# Patient Record
Sex: Male | Born: 1985 | Race: Black or African American | Hispanic: No | Marital: Single | Smoking: Current every day smoker
Health system: Southern US, Community
[De-identification: ages and names within clinical notes are randomized; demographics above are authoritative.]

## PROBLEM LIST (undated history)

## (undated) HISTORY — PX: KNEE SURGERY: SHX244

---
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
## 2010-04-19 ENCOUNTER — Emergency Department: Payer: Self-pay | Admitting: Emergency Medicine

## 2011-11-07 ENCOUNTER — Emergency Department: Payer: Self-pay | Admitting: Emergency Medicine

## 2011-11-18 ENCOUNTER — Emergency Department: Payer: Self-pay | Admitting: Emergency Medicine

## 2015-10-17 ENCOUNTER — Encounter (HOSPITAL_COMMUNITY): Payer: Self-pay | Admitting: Emergency Medicine

## 2015-10-17 ENCOUNTER — Emergency Department (HOSPITAL_COMMUNITY): Payer: No Typology Code available for payment source

## 2015-10-17 ENCOUNTER — Emergency Department (HOSPITAL_COMMUNITY)
Admission: EM | Admit: 2015-10-17 | Discharge: 2015-10-17 | Disposition: A | Discharge status: Home / Self Care | Payer: No Typology Code available for payment source | Attending: Emergency Medicine | Admitting: Emergency Medicine

## 2015-10-17 DIAGNOSIS — F17200 Nicotine dependence, unspecified, uncomplicated: Secondary | ICD-10-CM | POA: Insufficient documentation

## 2015-10-17 DIAGNOSIS — Y9389 Activity, other specified: Secondary | ICD-10-CM | POA: Diagnosis not present

## 2015-10-17 DIAGNOSIS — S3992XA Unspecified injury of lower back, initial encounter: Secondary | ICD-10-CM | POA: Diagnosis not present

## 2015-10-17 DIAGNOSIS — S8992XA Unspecified injury of left lower leg, initial encounter: Secondary | ICD-10-CM | POA: Insufficient documentation

## 2015-10-17 DIAGNOSIS — Y998 Other external cause status: Secondary | ICD-10-CM | POA: Insufficient documentation

## 2015-10-17 DIAGNOSIS — Y92410 Unspecified street and highway as the place of occurrence of the external cause: Secondary | ICD-10-CM | POA: Insufficient documentation

## 2015-10-17 DIAGNOSIS — M7918 Myalgia, other site: Secondary | ICD-10-CM

## 2015-10-17 MED ORDER — METHOCARBAMOL 500 MG PO TABS: 1000.0000 mg | ORAL_TABLET | Freq: Four times a day (QID) | ORAL | Status: DC | PRN
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
## 2015-10-17 NOTE — ED Notes (Signed)
Patient left at this time with all belongings. 

## 2015-10-30 ENCOUNTER — Encounter: Payer: Self-pay | Admitting: Emergency Medicine

## 2015-10-30 DIAGNOSIS — M25562 Pain in left knee: Secondary | ICD-10-CM | POA: Insufficient documentation

## 2015-10-30 DIAGNOSIS — F17200 Nicotine dependence, unspecified, uncomplicated: Secondary | ICD-10-CM | POA: Insufficient documentation
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
## 2015-10-31 ENCOUNTER — Emergency Department
Admission: EM | Admit: 2015-10-31 | Discharge: 2015-10-31 | Disposition: A | Discharge status: Home / Self Care | Payer: No Typology Code available for payment source | Attending: Emergency Medicine | Admitting: Emergency Medicine

## 2015-10-31 DIAGNOSIS — M25562 Pain in left knee: Secondary | ICD-10-CM
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
## 2016-02-03 ENCOUNTER — Encounter: Payer: Self-pay | Admitting: Emergency Medicine

## 2016-02-03 ENCOUNTER — Emergency Department
Admission: EM | Admit: 2016-02-03 | Discharge: 2016-02-03 | Disposition: A | Discharge status: Home / Self Care | Payer: Worker's Compensation | Attending: Emergency Medicine | Admitting: Emergency Medicine

## 2016-02-03 DIAGNOSIS — Y990 Civilian activity done for income or pay: Secondary | ICD-10-CM | POA: Insufficient documentation

## 2016-02-03 DIAGNOSIS — F17210 Nicotine dependence, cigarettes, uncomplicated: Secondary | ICD-10-CM | POA: Diagnosis not present

## 2016-02-03 DIAGNOSIS — Y9389 Activity, other specified: Secondary | ICD-10-CM | POA: Insufficient documentation

## 2016-02-03 DIAGNOSIS — S61012A Laceration without foreign body of left thumb without damage to nail, initial encounter: Secondary | ICD-10-CM | POA: Insufficient documentation

## 2016-02-03 DIAGNOSIS — Z23 Encounter for immunization: Secondary | ICD-10-CM

## 2016-02-03 DIAGNOSIS — W268XXA Contact with other sharp object(s), not elsewhere classified, initial encounter: Secondary | ICD-10-CM | POA: Diagnosis not present

## 2016-02-03 DIAGNOSIS — Y929 Unspecified place or not applicable: Secondary | ICD-10-CM | POA: Insufficient documentation

## 2016-02-03 MED ORDER — LIDOCAINE HCL (PF) 1 % IJ SOLN
5.0000 mL | Freq: Once | INTRAMUSCULAR | Status: AC
  Administered 2016-02-03: 5 mL
  Filled 2016-02-03: qty 5

## 2016-02-03 MED ORDER — IBUPROFEN 600 MG PO TABS: 600.0000 mg | ORAL_TABLET | Freq: Four times a day (QID) | ORAL | Status: DC | PRN

## 2016-02-03 MED ORDER — TETANUS-DIPHTH-ACELL PERTUSSIS 5-2.5-18.5 LF-MCG/0.5 IM SUSP
0.5000 mL | Freq: Once | INTRAMUSCULAR | Status: AC
  Administered 2016-02-03: 0.5 mL via INTRAMUSCULAR
  Filled 2016-02-03: qty 0.5

## 2016-02-03 MED ORDER — CEPHALEXIN 500 MG PO CAPS: 500.0000 mg | ORAL_CAPSULE | Freq: Three times a day (TID) | ORAL | Status: DC

## 2016-02-03 NOTE — ED Notes (Signed)
Pt here for cut on left thumb from box cutter. He reports that injured occurred at work. Workers comp. UA was collected by ED Tech.

## 2016-02-03 NOTE — ED Provider Notes (Signed)
[facility] Emergency Department Provider Note  ____________________________________________  Time seen: Approximately 7:29 AM  I have reviewed the triage vital signs and the nursing notes.   HISTORY  Chief Complaint Extremity Laceration    HPI Randy Bonilla is a 30 y.o. male , NAD, presents to the emergency department with a laceration to left thumb. States he was at work using a box cutter when it slipped cutting his left thumb. Patient notes that he was not wearing his gloves which normally protect his hands. Denies any numbness, weakness, tingling to the thumb. Has had full range of motion of the thumb since the incident. Last tetanus vaccine was about 9 years ago.   History reviewed. No pertinent past medical history.  There are no active problems to display for this patient.   History reviewed. No pertinent past surgical history.  Current Outpatient Rx  Name  Route  Sig  Dispense  Refill  . cephALEXin (KEFLEX) 500 MG capsule   Oral   Take 1 capsule (500 mg total) by mouth 3 (three) times daily.   21 capsule   0   . ibuprofen (ADVIL,MOTRIN) 600 MG tablet   Oral   Take 1 tablet (600 mg total) by mouth every 6 (six) hours as needed.   30 tablet   0   . methocarbamol (ROBAXIN) 500 MG tablet   Oral   Take 2 tablets (1,000 mg total) by mouth 4 (four) times daily as needed (Pain).   20 tablet   0     Allergies Review of patient's allergies indicates no known allergies.  No family history on file.  Social History Social History  Substance Use Topics  . Smoking status: Current Every Day Smoker    Types: Cigarettes  . Smokeless tobacco: Never Used  . Alcohol Use: Yes     Review of Systems Constitutional: No fever/chills, fatigue Eyes: No visual changes. Cardiovascular: No chest pain. Respiratory:  No shortness of breath.  Musculoskeletal: Positive left thumb pain at site of laceration.  Skin: Positive laceration left thumb.  Negative for rash. Neurological: Negative for headaches, focal weakness or numbness. No tingling 10-point ROS otherwise negative.  ____________________________________________   PHYSICAL EXAM:  VITAL SIGNS: ED Triage Vitals  Enc Vitals Group     BP 02/03/16 0654 129/83 mmHg     Pulse Rate 02/03/16 0654 61     Resp 02/03/16 0654 18     Temp 02/03/16 0654 97.7 F (36.5 C)     Temp Source 02/03/16 0654 Oral     SpO2 02/03/16 0654 99 %     Weight 02/03/16 0654 150 lb (68.04 kg)     Height 02/03/16 0654 5\' 9"  (1.753 m)     Head Cir --      Peak Flow --      Pain Score 02/03/16 0655 6     Pain Loc --      Pain Edu? --      Excl. in GC? --      Constitutional: Alert and oriented. Well appearing and in no acute distress. Eyes: Conjunctivae are normal.  Head: Atraumatic. Cardiovascular: Good peripheral circulation with 2+ pulses noted in the left upper extremity. Capillary refill is brisk about all digits of the left hand. Respiratory: Normal respiratory effort without tachypnea or retractions. Musculoskeletal: Full range of motion of the left thumb without pain. Flexor tendon is intact. No edema.  No joint effusions. Neurologic:  Normal speech and language. No gross focal  neurologic deficits are appreciated.  Skin:  2cm linear laceration about the tip of the left thumb, underneath the tip of the thumbnail extending to the medial thumb. Bleeding is controlled. No evidence of foreign bodies. Skin is warm, dry. Psychiatric: Mood and affect are normal. Speech and behavior are normal. Patient exhibits appropriate insight and judgement.   ____________________________________________   LABS  None ____________________________________________  EKG  None ____________________________________________  RADIOLOGY  None ____________________________________________    PROCEDURES  Procedure(s) performed: LACERATION REPAIR Performed by: Hope PigeonJami L Masiyah Jorstad Authorized by: Hope PigeonJami L  Kabir Brannock Consent: Verbal consent obtained. Risks and benefits: risks, benefits and alternatives were discussed Consent given by: patient Patient identity confirmed: provided demographic data Prepped and Draped in normal sterile fashion Wound explored  Laceration Location: Tip of left thumb  Laceration Length: 2cm  No Foreign Bodies seen or palpated  Anesthesia: local infiltration  Local anesthetic: lidocaine 1% w/o epinephrine  Anesthetic total: 3.5 ml  Irrigation method: syringe Amount of cleaning: standard  Skin closure: 4-0 ethilon  Number of sutures: 6  Technique: simple interrupted  Patient tolerance: Patient tolerated the procedure well with no immediate complications.    Medications  Tdap (BOOSTRIX) injection 0.5 mL (0.5 mLs Intramuscular Given 02/03/16 0745)  lidocaine (PF) (XYLOCAINE) 1 % injection 5 mL (5 mLs Infiltration Given 02/03/16 0903)     ____________________________________________   INITIAL IMPRESSION / ASSESSMENT AND PLAN / ED COURSE  Patient's diagnosis is consistent with Laceration of left thumb without foreign body without damage to nail. Patient will be discharged home with prescriptions for Keflex and ibuprofen to take as directed. Patient's thumb was splinted in a metal static finger splint to protect the thumb from trauma. [facility] for wound recheck. [facility] need to [facility] in 5-7 days for suture removal. Patient is given ED precautions to return to the ED for any worsening or new symptoms.     ____________________________________________  FINAL CLINICAL IMPRESSION(S) / ED DIAGNOSES  Final diagnoses:  Laceration of left thumb without foreign body without damage to nail, initial encounter  Need for tetanus booster      NEW MEDICATIONS STARTED DURING THIS VISIT:  Discharge Medication List as of 02/03/2016  8:56 AM    START taking these medications   Details   cephALEXin (KEFLEX) 500 MG capsule Take 1 capsule (500 mg total) by mouth 3 (three) times daily., Starting 02/03/2016, Until Discontinued, Print    ibuprofen (ADVIL,MOTRIN) 600 MG tablet Take 1 tablet (600 mg total) by mouth every 6 (six) hours as needed., Starting 02/03/2016, Until Discontinued, Print             Hope PigeonJami L Bryton Romagnoli, PA-C 02/03/16 0915  Emily FilbertJonathan E Williams, MD 02/03/16 1000

## 2016-02-03 NOTE — ED Notes (Signed)
No answer when called for triage 

## 2016-02-03 NOTE — ED Notes (Addendum)
Pt presents to ED from work with c/o laceration to the top of his left thumb. Bleeding controlled.

## 2016-02-03 NOTE — Discharge Instructions (Signed)
Laceration Care, Adult °A laceration is a cut that goes through all of the layers of the skin and into the tissue that is right under the skin. Some lacerations heal on their own. Others need to be closed with stitches (sutures), staples, skin adhesive strips, or skin glue. Proper laceration care minimizes the risk of infection and helps the laceration to heal better. °HOW TO CARE FOR YOUR LACERATION °If sutures or staples were used: °· Keep the wound clean and dry. °· If you were given a bandage (dressing), you should change it at least one time per day or as told by your health care provider. You should also change it if it becomes wet or dirty. °· Keep the wound completely dry for the first 24 hours or as told by your health care provider. After that time, you may shower or bathe. However, make sure that the wound is not soaked in water until after the sutures or staples have been removed. °· Clean the wound one time each day or as told by your health care provider: °· Wash the wound with soap and water. °· Rinse the wound with water to remove all soap. °· Pat the wound dry with a clean towel. Do not rub the wound. °· After cleaning the wound, apply a thin layer of antibiotic ointment as told by your health care provider. This will help to prevent infection and keep the dressing from sticking to the wound. °· Have the sutures or staples removed as told by your health care provider. °If skin adhesive strips were used: °· Keep the wound clean and dry. °· If you were given a bandage (dressing), you should change it at least one time per day or as told by your health care provider. You should also change it if it becomes dirty or wet. °· Do not get the skin adhesive strips wet. You may shower or bathe, but be careful to keep the wound dry. °· If the wound gets wet, pat it dry with a clean towel. Do not rub the wound. °· Skin adhesive strips fall off on their own. You may trim the strips as the wound heals. Do not  remove skin adhesive strips that are still stuck to the wound. They will fall off in time. °If skin glue was used: °· Try to keep the wound dry, but you may briefly wet it in the shower or bath. Do not soak the wound in water, such as by swimming. °· After you have showered or bathed, gently pat the wound dry with a clean towel. Do not rub the wound. °· Do not do any activities that will make you sweat heavily until the skin glue has fallen off on its own. °· Do not apply liquid, cream, or ointment medicine to the wound while the skin glue is in place. Using those may loosen the film before the wound has healed. °· If you were given a bandage (dressing), you should change it at least one time per day or as told by your health care provider. You should also change it if it becomes dirty or wet. °· If a dressing is placed over the wound, be careful not to apply tape directly over the skin glue. Doing that may cause the glue to be pulled off before the wound has healed. °· Do not pick at the glue. The skin glue usually remains in place for 5-10 days, then it falls off of the skin. °General Instructions °· Take over-the-counter and prescription   medicines only as told by your health care provider.  If you were prescribed an antibiotic medicine or ointment, take or apply it as told by your doctor. Do not stop using it even if your condition improves.  To help prevent scarring, make sure to cover your wound with sunscreen whenever you are outside after stitches are removed, after adhesive strips are removed, or when glue remains in place and the wound is healed. Make sure to wear a sunscreen of at least 30 SPF.  Do not scratch or pick at the wound.  Keep all follow-up visits as told by your health care provider. This is important.  Check your wound every day for signs of infection. Watch for:  Redness, swelling, or pain.  Fluid, blood, or pus.  Raise (elevate) the injured area above the level of your heart  while you are sitting or lying down, if possible. SEEK MEDICAL CARE IF:  You received a tetanus shot and you have swelling, severe pain, redness, or bleeding at the injection site.  You have a fever.  A wound that was closed breaks open.  You notice a bad smell coming from your wound or your dressing.  You notice something coming out of the wound, such as wood or glass.  Your pain is not controlled with medicine.  You have increased redness, swelling, or pain at the site of your wound.  You have fluid, blood, or pus coming from your wound.  You notice a change in the color of your skin near your wound.  You need to change the dressing frequently due to fluid, blood, or pus draining from the wound.  You develop a new rash.  You develop numbness around the wound. SEEK IMMEDIATE MEDICAL CARE IF:  You develop severe swelling around the wound.  Your pain suddenly increases and is severe.  You develop painful lumps near the wound or on skin that is anywhere on your body.  You have a red streak going away from your wound.  The wound is on your hand or foot and you cannot properly move a finger or toe.  The wound is on your hand or foot and you notice that your fingers or toes look pale or bluish.   This information is not intended to replace advice given to you by your health care provider. Make sure you discuss any questions you have with your health care provider.   Document Released: 07/29/2005 Document Revised: 12/13/2014 Document Reviewed: 07/25/2014 Elsevier Interactive Patient Education 2016 [employer].  Stitches, [place], or Adhesive Wound Closure Health care providers use stitches (sutures), staples, and certain glue (skin adhesives) to hold skin together while it heals (wound closure). You may need this treatment after you have surgery or if you cut your skin accidentally. These methods help your skin to heal more quickly and make it less likely that you will have  a scar. A wound may take several months to heal completely. The type of wound you have determines when your wound gets closed. In most cases, the wound is closed as soon as possible (primary skin closure). Sometimes, closure is delayed so the wound can be cleaned and allowed to heal naturally. This reduces the chance of infection. Delayed closure may be needed if your wound:  Is caused by a bite.  Happened more than 6 hours ago.  Involves loss of skin or the tissues under the skin.  Has dirt or debris in it that cannot be removed.  Is infected. WHAT  ARE THE DIFFERENT KINDS OF WOUND CLOSURES? °There are many options for wound closure. The one that your health care provider uses depends on how deep and how large your wound is. °Adhesive Glue °To use this type of glue to close a wound, your health care provider holds the edges of the wound together and paints the glue on the surface of your skin. You may need more than one layer of glue. Then the wound may be covered with a light bandage (dressing). °This type of skin closure may be used for small wounds that are not deep (superficial). Using glue for wound closure is less painful than other methods. It does not require a medicine that numbs the area (local anesthetic). This method also leaves nothing to be removed. Adhesive glue is often used for children and on facial wounds. °Adhesive glue cannot be used for wounds that are deep, uneven, or bleeding. It is not used inside of a wound.  °Adhesive Strips °These strips are made of sticky (adhesive), porous paper. They are applied across your skin edges like a regular adhesive bandage. You leave them on until they fall off. °Adhesive strips may be used to close very superficial wounds. They may also be used along with sutures to improve the closure of your skin edges.  °Sutures °Sutures are the oldest method of wound closure. Sutures can be made from natural substances, such as silk, or from synthetic  materials, such as nylon and steel. They can be made from a material that your body can break down as your wound heals (absorbable), or they can be made from a material that needs to be removed from your skin (nonabsorbable). They come in many different strengths and sizes. °Your health care provider attaches the sutures to a steel needle on one end. Sutures can be passed through your skin, or through the tissues beneath your skin. Then they are tied and cut. Your skin edges may be closed in one continuous stitch or in separate stitches. °Sutures are strong and can be used for all kinds of wounds. Absorbable sutures may be used to close tissues under the skin. The disadvantage of sutures is that they may cause skin reactions that lead to infection. Nonabsorbable sutures need to be removed. °Staples °When surgical staples are used to close a wound, the edges of your skin on both sides of the wound are brought close together. A staple is placed across the wound, and an instrument secures the edges together. Staples are often used to close surgical cuts (incisions). °Staples are faster to use than sutures, and they cause less skin reaction. Staples need to be removed using a tool that bends the staples away from your skin. °HOW DO I CARE FOR MY WOUND CLOSURE? °· Take medicines only as directed by your health care provider. °· If you were prescribed an antibiotic medicine for your wound, finish it all even if you start to feel better. °· Use ointments or creams only as directed by your health care provider. °· Wash your hands with soap and water before and after touching your wound. °· Do not soak your wound in water. Do not take baths, swim, or use a hot tub until your health care provider approves. °· Ask your health care provider when you can start showering. Cover your wound if directed by your health care provider. °· Do not take out your own sutures or staples. °· Do not pick at your wound. Picking can cause an  infection. °·   Keep all follow-up visits as directed by your health care provider. This is important. HOW LONG WILL I HAVE MY WOUND CLOSURE?  Leave adhesive glue on your skin until the glue peels away.  Leave adhesive strips on your skin until the strips fall off.  Absorbable sutures will dissolve within several days.  Nonabsorbable sutures and staples must be removed. The location of the wound will determine how long they stay in. This can range from several days to a couple of weeks. WHEN SHOULD I SEEK HELP FOR MY WOUND CLOSURE? Contact your health care provider if:  You have a fever.  You have chills.  You have drainage, redness, swelling, or pain at your wound.  There is a bad smell coming from your wound.  The skin edges of your wound start to separate after your sutures have been removed.  Your wound becomes thick, raised, and darker in color after your sutures come out (scarring).   This information is not intended to replace advice given to you by your health care provider. Make sure you discuss any questions you have with your health care provider.   Document Released: 04/23/2001 Document Revised: 08/19/2014 Document Reviewed: 01/05/2014 Elsevier Interactive Patient Education 2016 [employer]  Tdap Vaccine (Tetanus, Diphtheria and Pertussis): What You Need to Know 1. Why get vaccinated? Tetanus, diphtheria and pertussis are very serious diseases. Tdap vaccine can protect us from these diseases. And, Tdap vaccine given to pregnant women can protect newborn babies against pertussis. TETANUS (Lockjaw) is rare in the [place] today. It causes painful muscle tightening and stiffness, usually all over the body.  It can lead to tightening of muscles in the head and neck so you can't open your mouth, swallow, or sometimes even breathe. Tetanus kills about 1 out of 10 people who are infected even after receiving the best medical care. DIPHTHERIA is also rare in the [place] today. It can cause a thick coating to form in the back of the throat.  It can lead to breathing problems, heart failure, paralysis, and death. PERTUSSIS (Whooping Cough) causes severe coughing spells, which can cause difficulty breathing, vomiting and disturbed sleep.  It can also lead to weight loss, incontinence, and rib fractures. Up to 2 in 100 adolescents and 5 in 100 adults with pertussis are hospitalized or have complications, which could include pneumonia or death. These diseases are caused by bacteria. Diphtheria and pertussis are spread from person to person through secretions from coughing or sneezing. Tetanus enters the body through cuts, scratches, or wounds. Before vaccines, as many as 200,000 cases of diphtheria, 200,000 cases of pertussis, and hundreds of cases of tetanus, were reported in the [place] each year. Since vaccination began, reports of cases for tetanus and diphtheria have dropped by about 99% and for pertussis by about 80%. 2. Tdap vaccine Tdap vaccine can protect adolescents and adults from tetanus, diphtheria, and pertussis. One dose of Tdap is routinely given at age 28 or 2412. People who did not get Tdap at that age should get it as soon as possible. Tdap is especially important for healthcare professionals and anyone having close contact with a baby younger than 12 months. Pregnant women should get a dose of Tdap during every pregnancy, to protect the newborn from pertussis. Infants are most at risk for severe, life-threatening complications from pertussis. Another vaccine, called Td, protects against tetanus and diphtheria, but not pertussis. A Td booster should be given every 10 years. Tdap may be given  as one of these boosters if you have never gotten Tdap before. Tdap may also be given after a severe cut or burn to prevent tetanus infection. Your doctor or the person giving you the vaccine can give you more information. Tdap may safely be given at the  same time as other vaccines. 3. Some people should not get this vaccine  A person who has ever had a life-threatening allergic reaction after a previous dose of any diphtheria, tetanus or pertussis containing vaccine, OR has a severe allergy to any part of this vaccine, should not get Tdap vaccine. Tell the person giving the vaccine about any severe allergies.  Anyone who had coma or long repeated seizures within 7 days after a childhood dose of DTP or DTaP, or a previous dose of Tdap, should not get Tdap, unless a cause other than the vaccine was found. They can still get Td.  Talk to your doctor if you:  have seizures or another nervous system problem,  had severe pain or swelling after any vaccine containing diphtheria, tetanus or pertussis,  ever had a condition called Guillain-Barr Syndrome (GBS),  aren't feeling well on the day the shot is scheduled. 4. Risks With any medicine, including vaccines, there is a chance of side effects. These are usually mild and go away on their own. Serious reactions are also possible but are rare. Most people who get Tdap vaccine do not have any problems with it. Mild problems following Tdap (Did not interfere with activities)  Pain where the shot was given (about 3 in 4 adolescents or 2 in 3 adults)  Redness or swelling where the shot was given (about 1 person in 5)  Mild fever of at least 100.34F (up to about 1 in 25 adolescents or 1 in 100 adults)  Headache (about 3 or 4 people in 10)  Tiredness (about 1 person in 3 or 4)  Nausea, vomiting, diarrhea, stomach ache (up to 1 in 4 adolescents or 1 in 10 adults)  Chills, sore joints (about 1 person in 10)  Body aches (about 1 person in 3 or 4)  Rash, swollen glands (uncommon) Moderate problems following Tdap (Interfered with activities, but did not require medical attention)  Pain where the shot was given (up to 1 in 5 or 6)  Redness or swelling where the shot was given (up to about 1  in 16 adolescents or 1 in 12 adults)  Fever over 102F (about 1 in 100 adolescents or 1 in 250 adults)  Headache (about 1 in 7 adolescents or 1 in 10 adults)  Nausea, vomiting, diarrhea, stomach ache (up to 1 or 3 people in 100)  Swelling of the entire arm where the shot was given (up to about 1 in 500). Severe problems following Tdap (Unable to perform usual activities; required medical attention)  Swelling, severe pain, bleeding and redness in the arm where the shot was given (rare). Problems that could happen after any vaccine:  People sometimes faint after a medical procedure, including vaccination. Sitting or lying down for about 15 minutes can help prevent fainting, and injuries caused by a fall. Tell your doctor if you feel dizzy, or have vision changes or ringing in the ears.  Some people get severe pain in the shoulder and have difficulty moving the arm where a shot was given. This happens very rarely.  Any medication can cause a severe allergic reaction. Such reactions from a vaccine are very rare, estimated at fewer than 1 in a  million doses, and would happen within a few minutes to a few hours after the vaccination. As with any medicine, there is a very remote chance of a vaccine causing a serious injury or death. The safety of vaccines is always being monitored. For more information, visit: http://floyd.org/ 5. What if there is a serious problem? What should I look for?  Look for anything that concerns you, such as signs of a severe allergic reaction, very high fever, or unusual behavior.  Signs of a severe allergic reaction can include hives, swelling of the face and throat, difficulty breathing, a fast heartbeat, dizziness, and weakness. These would usually start a few minutes to a few hours after the vaccination. What should I do?  If you think it is a severe allergic reaction or other emergency that can't wait, call 9-1-1 or get the person to the nearest  hospital. Otherwise, call your doctor.  Afterward, the reaction should be reported to the Vaccine Adverse Event Reporting System (VAERS). Your doctor might file this report, or you can do it yourself through the VAERS web site at www.vaers.LAgents.no, or by calling 1-(479)639-7152. VAERS does not give medical advice.  6. The National Vaccine Injury Compensation Program The [employer] Vaccine Injury Compensation Program (VICP) is a federal program that was created to compensate people who may have been injured by certain vaccines. Persons who believe they may have been injured by a vaccine can learn about the program and about filing a claim by calling 1-(352)211-3237 or visiting the VICP website at SpiritualWord.at. There is a time limit to file a claim for compensation. 7. How can I learn more?  Ask your doctor. He or she can give you the vaccine package insert or suggest other sources of information.  Call your local or state health department.  Contact the Centers for Disease Control and Prevention (CDC):  Call (250)441-9670 (1-800-CDC-INFO) or  Visit CDC's website at PicCapture.uy CDC Tdap Vaccine VIS (10/05/13)   This information is not intended to replace advice given to you by your health care provider. Make sure you discuss any questions you have with your health care provider.   Document Released: 01/28/2012 Document Revised: 08/19/2014 Document Reviewed: 11/10/2013 Elsevier Interactive Patient Education [employer]

## 2016-02-05 ENCOUNTER — Ambulatory Visit
Admission: EM | Admit: 2016-02-05 | Discharge: 2016-02-05 | Disposition: A | Discharge status: Home / Self Care | Payer: Worker's Compensation | Attending: Internal Medicine | Admitting: Internal Medicine

## 2016-02-05 DIAGNOSIS — S61012D Laceration without foreign body of left thumb without damage to nail, subsequent encounter: Secondary | ICD-10-CM

## 2016-02-05 NOTE — ED Provider Notes (Signed)
CSN: [id number]     Arrival date & time 02/05/16  [id number] History   First MD Initiated Contact with Patient 02/05/16 1041     Chief Complaint  Patient presents with  . Finger Injury    Recheck Workers Comp   HPI 30 year old patient who was seen in the ED on 6/24, after cutting his distal left thumb with a box cutter. Had sutures placed, and is now here for wound check. Not having redness/swelling/drainage/increased pain. No fever.  History reviewed. No pertinent past medical history. History reviewed. No pertinent past surgical history. History reviewed. No pertinent family history. Social History  Substance Use Topics  . Smoking status: Current Every Day Smoker    Types: Cigarettes  . Smokeless tobacco: Never Used  . Alcohol Use: Yes    Review of Systems  All other systems reviewed and are negative.   Allergies  Review of patient's allergies indicates no known allergies.  Home Medications   Prior to Admission medications   Medication Sig Start Date End Date Taking? Authorizing Provider  cephALEXin (KEFLEX) 500 MG capsule Take 1 capsule (500 mg total) by mouth 3 (three) times daily. 02/03/16  Yes Jami L Hagler, PA-C  ibuprofen (ADVIL,MOTRIN) 600 MG tablet Take 1 tablet (600 mg total) by mouth every 6 (six) hours as needed. 02/03/16  Yes Jami L Hagler, PA-C  methocarbamol (ROBAXIN) 500 MG tablet Take 2 tablets (1,000 mg total) by mouth 4 (four) times daily as needed (Pain). 10/17/15   Nicole Pisciotta, PA-C      BP 102/61 mmHg  Pulse 70  Temp(Src) 98.2 F (36.8 C) (Oral)  Resp 18  Ht 5\' 10"  (1.778 m)  Wt 150 lb (68.04 kg)  BMI 21.52 kg/m2  SpO2 100% Physical Exam  Constitutional: He is oriented to person, place, and time. No distress.  Alert, nicely groomed  HENT:  Head: Atraumatic.  Eyes:  Conjugate gaze, no eye redness/drainage  Neck: Neck supple.  Cardiovascular: Normal rate.   Pulmonary/Chest: No respiratory distress.  Abdominal: He exhibits no distension.    Musculoskeletal: Normal range of motion.       Hands: Neurological: He is alert and oriented to person, place, and time.  Skin: Skin is warm and dry.  Left thumb with minimal swelling, no significant erythema. Wound is not bleeding or draining. Sutures intact.  Nursing note and vitals reviewed.   ED Course  Procedures (including critical care time) none  MDM   1. Thumb laceration, left, subsequent encounter    Suture removal 02/12/16.  Wear splint when at work, until sutures removed. Finish cephalexin prescription given at ED visit 02/03/16. Wash wound gently with soap/water and apply antibiotic ointment/bandage 1-2x daily until healed.   Recheck for increasing redness/swelling/pain/drainage or for new fever>100.5.    Eustace MooreLaura W Jamarion [place], MD 02/14/16 [phone]

## 2016-02-05 NOTE — Discharge Instructions (Signed)
Suture removal 02/12/16.  Wear splint when at work, until sutures removed. Finish cephalexin prescription given at ED visit 02/03/16. Wash wound gently with soap/water and apply antibiotic ointment/bandage 1-2x daily until healed.   Recheck for increasing redness/swelling/pain/drainage or for new fever>100.5.

## 2016-02-05 NOTE — ED Notes (Signed)
Patient is here for a wound recheck on his left thumb. Stitches appear to be intact and wound seems to be healing good.

## 2016-02-12 ENCOUNTER — Ambulatory Visit
Admission: EM | Admit: 2016-02-12 | Discharge: 2016-02-12 | Disposition: A | Discharge status: Home / Self Care | Payer: Worker's Compensation | Attending: Family Medicine | Admitting: Family Medicine

## 2016-02-12 DIAGNOSIS — Z4802 Encounter for removal of sutures: Secondary | ICD-10-CM

## 2016-02-12 DIAGNOSIS — S6992XD Unspecified injury of left wrist, hand and finger(s), subsequent encounter: Secondary | ICD-10-CM | POA: Diagnosis not present

## 2016-02-12 NOTE — Discharge Instructions (Signed)

## 2016-02-12 NOTE — ED Notes (Addendum)
WC- Thumb Injury recheck. Stitches placed 02/05/2016. Healing well

## 2016-02-12 NOTE — ED Provider Notes (Signed)
CSN: [id number]     Arrival date & time 02/12/16  1223 History   First MD Initiated Contact with Patient 02/12/16 1302     Chief Complaint  Patient presents with  . Finger Injury    WC Thumb Recheck   (Consider location/radiation/quality/duration/timing/severity/associated sxs/prior Treatment) HPI Comments: Patient presents for recheck of worker's comp injury from 02/03/16. Stitches have been in place for 10 days on his distal thumb. Wound has been healing well with minimal pain now. Requests suture removal.   The history is provided by the patient.    History reviewed. No pertinent past medical history. History reviewed. No pertinent past surgical history. History reviewed. No pertinent family history. Social History  Substance Use Topics  . Smoking status: Current Every Day Smoker    Types: Cigarettes  . Smokeless tobacco: Never Used  . Alcohol Use: Yes    Review of Systems  Allergies  Review of patient's allergies indicates no known allergies.  Home Medications   Prior to Admission medications   Medication Sig Start Date End Date Taking? Authorizing Provider  cephALEXin (KEFLEX) 500 MG capsule Take 1 capsule (500 mg total) by mouth 3 (three) times daily. 02/03/16  Yes Jami L Hagler, PA-C  ibuprofen (ADVIL,MOTRIN) 600 MG tablet Take 1 tablet (600 mg total) by mouth every 6 (six) hours as needed. 02/03/16  Yes Jami L Hagler, PA-C  methocarbamol (ROBAXIN) 500 MG tablet Take 2 tablets (1,000 mg total) by mouth 4 (four) times daily as needed (Pain). 10/17/15   Joni ReiningNicole Pisciotta, PA-C   Meds Ordered and Administered this Visit  Medications - No data to display  BP 98/67 mmHg  Pulse 80  Temp(Src) 98.2 F (36.8 C) (Oral)  Resp 18  Ht 5\' 10"  (1.778 m)  Wt 150 lb (68.04 kg)  BMI 21.52 kg/m2  SpO2 96% No data found.   Physical Exam  Constitutional: He is oriented to person, place, and time.  Musculoskeletal: Normal range of motion.       Hands: Left thumb has 6 sutures in  place near nail. Skin edges are well-approximated. Still slightly tender at very distal aspect of thumb. No decrease in sensation. Pulses are normal. Dried blood present along suture line.   Neurological: He is alert and oriented to person, place, and time.  Skin: Skin is warm and dry.    ED Course  Procedures (including critical care time)  Labs Review Labs Reviewed - No data to display  Imaging Review No results found.   Visual Acuity Review  Right Eye Distance:   Left Eye Distance:   Bilateral Distance:    Right Eye Near:   Left Eye Near:    Bilateral Near:         MDM   1. Visit for suture removal   2. Thumb injury, left, subsequent encounter    Cleaned area with alcohol swab. 6 sutures removed without difficulty. Applied triple antibiotic ointment to area and wrapped in gauze. May wash/clean with soap and water as usual. No restrictions for work but may cover with a bandage to prevent irritation during work. Recommend follow-up as needed.     Sudie GrumblingAnn Berry Anyelo Mccue, NP 02/12/16 2300

## 2016-04-17 ENCOUNTER — Emergency Department
Admission: EM | Admit: 2016-04-17 | Discharge: 2016-04-17 | Disposition: A | Discharge status: Home / Self Care | Payer: Self-pay | Attending: Emergency Medicine | Admitting: Emergency Medicine

## 2016-04-17 DIAGNOSIS — Z5321 Procedure and treatment not carried out due to patient leaving prior to being seen by health care provider: Secondary | ICD-10-CM | POA: Insufficient documentation

## 2016-04-17 DIAGNOSIS — Z0289 Encounter for other administrative examinations: Secondary | ICD-10-CM | POA: Insufficient documentation

## 2016-04-17 DIAGNOSIS — F17210 Nicotine dependence, cigarettes, uncomplicated: Secondary | ICD-10-CM | POA: Insufficient documentation

## 2016-04-17 NOTE — ED Notes (Addendum)
Pt ambulatory with steady gait. Pt reports was sent to ED from [facility] for required drug screen test per profile. Pt reports does not need to be seen by MD. Pt denies any injury.  Instructed to return for any concerns or complaints. Workers comp completed by [employer] per employee profile.
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
## 2017-01-07 ENCOUNTER — Emergency Department
Admission: EM | Admit: 2017-01-07 | Discharge: 2017-01-07 | Disposition: A | Discharge status: Home / Self Care | Payer: BLUE CROSS/BLUE SHIELD | Attending: Emergency Medicine | Admitting: Emergency Medicine

## 2017-01-07 DIAGNOSIS — L02612 Cutaneous abscess of left foot: Secondary | ICD-10-CM | POA: Diagnosis not present

## 2017-01-07 DIAGNOSIS — F17210 Nicotine dependence, cigarettes, uncomplicated: Secondary | ICD-10-CM | POA: Insufficient documentation

## 2017-01-07 DIAGNOSIS — M79672 Pain in left foot: Secondary | ICD-10-CM | POA: Diagnosis present

## 2017-01-07 MED ORDER — OXYCODONE-ACETAMINOPHEN 7.5-325 MG PO TABS: 1.0000 | ORAL_TABLET | Freq: Four times a day (QID) | ORAL | 0 refills | Status: DC | PRN

## 2017-01-07 MED ORDER — KETOROLAC TROMETHAMINE 60 MG/2ML IM SOLN
60.0000 mg | Freq: Once | INTRAMUSCULAR | Status: AC
  Administered 2017-01-07: 60 mg via INTRAMUSCULAR
  Filled 2017-01-07: qty 2

## 2017-01-07 MED ORDER — SULFAMETHOXAZOLE-TRIMETHOPRIM 800-160 MG PO TABS: 1.0000 | ORAL_TABLET | Freq: Two times a day (BID) | ORAL | 0 refills | Status: DC

## 2017-01-07 MED ORDER — HYDROMORPHONE HCL 1 MG/ML IJ SOLN
1.0000 mg | Freq: Once | INTRAMUSCULAR | Status: AC
  Administered 2017-01-07: 1 mg via INTRAMUSCULAR
  Filled 2017-01-07: qty 1

## 2017-01-07 MED ORDER — LIDOCAINE HCL (PF) 1 % IJ SOLN
INTRAMUSCULAR | Status: AC
  Administered 2017-01-07: 5 mL
  Filled 2017-01-07: qty 5

## 2017-01-07 NOTE — ED Provider Notes (Signed)
[facility] Emergency Department Provider Note   ____________________________________________   First MD Initiated Contact with Patient 01/07/17 1324     (approximate)  I have reviewed the triage vital signs and the nursing notes.   HISTORY  Chief Complaint Foot Pain    HPI Randy Bonilla is a 31 y.o. male patient with a erythematous edematous right fifth toe.patient stated he was seen last week at urgent care clinic and prescribed antibiotics.Patient unsure of the dosage in high school to take the antibiotics. Patient did not have medicationwith him. History reviewed. No pertinent past medical history.  There are no active problems to display for this patient.   History reviewed. No pertinent surgical history.  Prior to Admission medications   Medication Sig Start Date End Date Taking? Authorizing Provider  cephALEXin (KEFLEX) 500 MG capsule Take 1 capsule (500 mg total) by mouth 3 (three) times daily. 02/03/16   Hagler, Jami L, PA-C  ibuprofen (ADVIL,MOTRIN) 600 MG tablet Take 1 tablet (600 mg total) by mouth every 6 (six) hours as needed. 02/03/16   Hagler, Jami L, PA-C  methocarbamol (ROBAXIN) 500 MG tablet Take 2 tablets (1,000 mg total) by mouth 4 (four) times daily as needed (Pain). 10/17/15   Pisciotta, Joni ReiningNicole, PA-C  oxyCODONE-acetaminophen (PERCOCET) 7.5-325 MG tablet Take 1 tablet by mouth every 6 (six) hours as needed for severe pain. 01/07/17   Joni ReiningSmith, Eliab Closson K, PA-C  sulfamethoxazole-trimethoprim (BACTRIM DS,SEPTRA DS) 800-160 MG tablet Take 1 tablet by mouth 2 (two) times daily. 01/07/17   Joni ReiningSmith, Braelyn Jenson K, PA-C    Allergies Patient has no known allergies.  No family history on file.  Social History Social History  Substance Use Topics  . Smoking status: Current Every Day Smoker    Types: Cigarettes  . Smokeless tobacco: Never Used  . Alcohol use Yes    Review of Systems  Constitutional: No fever/chills Eyes: No visual  changes. ENT: No sore throat. Cardiovascular: Denies chest pain. Respiratory: Denies shortness of breath. Gastrointestinal: No abdominal pain.  No nausea, no vomiting.  No diarrhea.  No constipation. Genitourinary: Negative for dysuria. Musculoskeletal: Negative for back pain. Skin: Negative for rash. Redness and swelling dorsal aspect of the fifth digit left foot Neurological: Negative for headaches, focal weakness or numbness.   ____________________________________________   PHYSICAL EXAM:  VITAL SIGNS: ED Triage Vitals [01/07/17 1328]  Enc Vitals Group     BP      Pulse      Resp      Temp      Temp src      SpO2      Weight      Height      Head Circumference      Peak Flow      Pain Score 10     Pain Loc      Pain Edu?      Excl. in GC?     Constitutional: Alert and oriented. Well appearing and in no acute distress. Cardiovascular: Normal rate, regular rhythm. Grossly normal heart sounds.  Good peripheral circulation. Respiratory: Normal respiratory effort.  No retractions. Lungs CTAB. Musculoskeletal: No lower extremity tenderness nor edema.  No joint effusions. Skin:  Skin is warm, dry and intact. No rash noted.Erythematous nodule lesion dorsal aspect the left foot Psychiatric: Mood and affect are normal. Speech and behavior are normal.  ____________________________________________   LABS (all labs ordered are listed, but only abnormal results are displayed)  Labs Reviewed - No  data to display ____________________________________________  EKG   ____________________________________________  RADIOLOGY   ____________________________________________   PROCEDURES  Procedure(s) performed: INCISION AND DRAINAGE Performed by: Joni Reining Consent: Verbal consent obtained. Risks and benefits: risks, benefits and alternatives were discussed Type: abscess  Body area:dorsal aspect the fifth digit left foot  Anesthesia: local infiltration  Incision  was made with a scalpel.  Local anesthetic: lidocaine 1% without epinephrine  Anesthetic total:62ml  Complexity: complex Blunt dissection to break up loculations  Drainage: purulent  Drainage amount: large  Patient tolerance: Patient tolerated the procedure well with no immediate complications.     Procedures  Critical Care performed: No  ____________________________________________   INITIAL IMPRESSION / ASSESSMENT AND PLAN / ED COURSE  Pertinent labs & imaging results that were available during my care of the patient were reviewed by me and considered in my medical decision making (see chart for details).  Abscess dose aspect of the left foot. Patient given discharge care instructions. Patient given a prescription for Bactrim DS and Percocets. Patient given a work note and advised follow-up to ED if condition worsens.      ____________________________________________   FINAL CLINICAL IMPRESSION(S) / ED DIAGNOSES  Final diagnoses:  Abscess of fifth toe of left foot      NEW MEDICATIONS STARTED DURING THIS VISIT:  Discharge Medication List as of 01/07/2017  2:04 PM    START taking these medications   Details  oxyCODONE-acetaminophen (PERCOCET) 7.5-325 MG tablet Take 1 tablet by mouth every 6 (six) hours as needed for severe pain., Starting Tue 01/07/2017, Print    sulfamethoxazole-trimethoprim (BACTRIM DS,SEPTRA DS) 800-160 MG tablet Take 1 tablet by mouth 2 (two) times daily., Starting Tue 01/07/2017, Print         Note:  This document was prepared using Dragon voice recognition software and may include unintentional dictation errors.    Joni Reining, PA-C 01/07/17 1410    Minna Antis, MD 01/07/17 1500

## 2017-01-07 NOTE — ED Notes (Signed)
Pt reports to ED w/ c/o L foot pain r/t toe fungus. Pt sts that he was seen at urgent care Saturday, given pain meds and antibiotics. Pt sts he has not been taking antibiotics as prescribed.

## 2017-10-21 ENCOUNTER — Other Ambulatory Visit: Payer: Self-pay

## 2017-10-21 ENCOUNTER — Emergency Department
Admission: EM | Admit: 2017-10-21 | Discharge: 2017-10-21 | Disposition: A | Discharge status: Home / Self Care | Payer: Worker's Compensation | Attending: Emergency Medicine | Admitting: Emergency Medicine

## 2017-10-21 DIAGNOSIS — S61012A Laceration without foreign body of left thumb without damage to nail, initial encounter: Secondary | ICD-10-CM | POA: Insufficient documentation

## 2017-10-21 DIAGNOSIS — Y990 Civilian activity done for income or pay: Secondary | ICD-10-CM | POA: Diagnosis not present

## 2017-10-21 DIAGNOSIS — Y929 Unspecified place or not applicable: Secondary | ICD-10-CM | POA: Diagnosis not present

## 2017-10-21 DIAGNOSIS — F17210 Nicotine dependence, cigarettes, uncomplicated: Secondary | ICD-10-CM | POA: Diagnosis not present

## 2017-10-21 DIAGNOSIS — X58XXXA Exposure to other specified factors, initial encounter: Secondary | ICD-10-CM | POA: Insufficient documentation

## 2017-10-21 DIAGNOSIS — Y939 Activity, unspecified: Secondary | ICD-10-CM | POA: Insufficient documentation

## 2017-10-21 MED ORDER — CEPHALEXIN 500 MG PO CAPS: 500.0000 mg | ORAL_CAPSULE | Freq: Two times a day (BID) | ORAL | 0 refills | Status: AC

## 2017-10-21 MED ORDER — TRAMADOL HCL 50 MG PO TABS: 50.0000 mg | ORAL_TABLET | Freq: Four times a day (QID) | ORAL | 0 refills | Status: AC | PRN

## 2017-10-21 MED ORDER — LIDOCAINE HCL (PF) 1 % IJ SOLN
INTRAMUSCULAR | Status: AC
  Filled 2017-10-21: qty 5

## 2017-10-21 NOTE — ED Triage Notes (Addendum)
Pt arrives to ED via POV with c/o LEFT thumb laceration. Pt wishes to file W/C. Pt arrives with bandage in place, no bleeding at this time.

## 2017-10-21 NOTE — ED Notes (Signed)
Workmans comp UDS complete and sample walked to lab.

## 2017-10-23 NOTE — ED Provider Notes (Signed)
[facility] Emergency Department Provider Note   First MD Initiated Contact with Patient 10/21/17 [phone]     (approximate)  I have reviewed the triage vital signs and the nursing notes.   HISTORY  Chief Complaint Laceration    HPI Randy Bonilla is a 32 y.o. male presents to the emergency department with left thumb laceration which occurred while at work.  Patient admits to 5 out of 5 left thumb pain at the site of the laceration.   Past medical history None There are no active problems to display for this patient.   Past surgical history None  Prior to Admission medications   Medication Sig Start Date End Date Taking? Authorizing Provider  cephALEXin (KEFLEX) 500 MG capsule Take 1 capsule (500 mg total) by mouth 3 (three) times daily. 02/03/16   Hagler, Jami L, PA-C  cephALEXin (KEFLEX) 500 MG capsule Take 1 capsule (500 mg total) by mouth 2 (two) times daily for 10 days. 10/21/17 10/31/17  Darci Current, MD  ibuprofen (ADVIL,MOTRIN) 600 MG tablet Take 1 tablet (600 mg total) by mouth every 6 (six) hours as needed. 02/03/16   Hagler, Jami L, PA-C  methocarbamol (ROBAXIN) 500 MG tablet Take 2 tablets (1,000 mg total) by mouth 4 (four) times daily as needed (Pain). 10/17/15   Pisciotta, Joni Reining, PA-C  oxyCODONE-acetaminophen (PERCOCET) 7.5-325 MG tablet Take 1 tablet by mouth every 6 (six) hours as needed for severe pain. 01/07/17   Joni Reining, PA-C  sulfamethoxazole-trimethoprim (BACTRIM DS,SEPTRA DS) 800-160 MG tablet Take 1 tablet by mouth 2 (two) times daily. 01/07/17   Joni Reining, PA-C  traMADol (ULTRAM) 50 MG tablet Take 1 tablet (50 mg total) by mouth every 6 (six) hours as needed. 10/21/17 10/21/18  Darci Current, MD    Allergies She is No family history on file.  Social History Social History   Tobacco Use  . Smoking status: Current Every Day Smoker    Types: Cigarettes  . Smokeless tobacco: Never Used  Substance Use Topics  .  Alcohol use: Yes  . Drug use: No    Review of Systems Constitutional: No fever/chills Eyes: No visual changes. ENT: No sore throat. Cardiovascular: Denies chest pain. Respiratory: Denies shortness of breath. Gastrointestinal: No abdominal pain.  No nausea, no vomiting.  No diarrhea.  No constipation. Genitourinary: Negative for dysuria. Musculoskeletal: Negative for neck pain.  Negative for back pain. Integumentary: Negative for rash.  Positive for left thumb laceration Neurological: Negative for headaches, focal weakness or numbness.  ____________________________________________   PHYSICAL EXAM:  VITAL SIGNS: ED Triage Vitals  Enc Vitals Group     BP 10/21/17 0107 119/76     Pulse Rate 10/21/17 0107 81     Resp 10/21/17 0107 18     Temp 10/21/17 0107 97.6 F (36.4 C)     Temp Source 10/21/17 0107 Oral     SpO2 10/21/17 0107 99 %     Weight 10/21/17 0108 62.6 kg (138 lb)     Height 10/21/17 0108 1.778 m (5\' 10" )     Head Circumference --      Peak Flow --      Pain Score 10/21/17 0108 7     Pain Loc --      Pain Edu? --      Excl. in GC? --     Constitutional: Alert and oriented. Well appearing and in no acute distress. Eyes: Conjunctivae are normal.  Head: Atraumatic. Mouth/Throat:  Mucous membranes are moist. Oropharynx non-erythematous. Neck: No stridor.  Musculoskeletal: No lower extremity tenderness nor edema. No gross deformities of extremities. Neurologic:  Normal speech and language. No gross focal neurologic deficits are appreciated.  Skin:  Skin is warm, dry.  C shaped 3 cm laceration tip of the left thumb Psychiatric: Mood and affect are normal. Speech and behavior are normal.   ____________________________________________   PROCEDURES    .Marland Kitchen.Laceration Repair Date/Time: 10/23/2017 7:58 AM Performed by: Darci CurrentBrown, [place] N, MD Authorized by: Darci CurrentBrown, [place] N, MD   Consent:    Consent obtained:  Verbal   Consent given by:  Patient   Risks  discussed:  Infection, pain, retained foreign body, poor cosmetic result and poor wound healing Anesthesia (see MAR for exact dosages):    Anesthesia method:  Local infiltration   Local anesthetic:  Lidocaine 1% w/o epi Laceration details:    Location:  Finger   Finger location:  L thumb   Length (cm):  3   Depth (mm):  3 Repair type:    Repair type:  Simple Exploration:    Hemostasis achieved with:  Direct pressure   Wound exploration: entire depth of wound probed and visualized     Contaminated: no   Treatment:    Area cleansed with:  Saline   Amount of cleaning:  Extensive   Irrigation solution:  Sterile saline   Visualized foreign bodies/material removed: no   Skin repair:    Repair method:  Sutures   Number of sutures:  4 Approximation:    Approximation:  Close Post-procedure details:    Dressing:  Sterile dressing   Patient tolerance of procedure:  Tolerated well, no immediate complications     ____________________________________________   INITIAL IMPRESSION / ASSESSMENT AND PLAN / ED COURSE  As part of my medical decision making, I reviewed the following data within the electronic MEDICAL RECORD NUMBER   32 year old male presenting with left thumb laceration repaired without any difficulty.  __________________________  FINAL CLINICAL IMPRESSION(S) / ED DIAGNOSES  Final diagnoses:  Laceration of left thumb without foreign body without damage to nail, initial encounter     MEDICATIONS GIVEN DURING THIS VISIT:  Medications - No data to display   ED Discharge Orders        Ordered    cephALEXin (KEFLEX) 500 MG capsule  2 times daily     10/21/17 0646    traMADol (ULTRAM) 50 MG tablet  Every 6 hours PRN     10/21/17 0646       Note:  This document was prepared using Dragon voice recognition software and may include unintentional dictation errors.    Darci CurrentBrown, [place] N, MD 10/23/17 [id number]

## 2020-03-09 ENCOUNTER — Encounter (HOSPITAL_COMMUNITY): Payer: Self-pay | Admitting: Emergency Medicine

## 2020-03-09 ENCOUNTER — Emergency Department (HOSPITAL_COMMUNITY)
Admission: EM | Admit: 2020-03-09 | Discharge: 2020-03-09 | Disposition: A | Discharge status: Home / Self Care | Payer: Self-pay | Attending: Emergency Medicine | Admitting: Emergency Medicine

## 2020-03-09 ENCOUNTER — Other Ambulatory Visit: Payer: Self-pay

## 2020-03-09 DIAGNOSIS — F17210 Nicotine dependence, cigarettes, uncomplicated: Secondary | ICD-10-CM | POA: Insufficient documentation

## 2020-03-09 DIAGNOSIS — L0231 Cutaneous abscess of buttock: Secondary | ICD-10-CM | POA: Insufficient documentation

## 2020-03-09 DIAGNOSIS — B9689 Other specified bacterial agents as the cause of diseases classified elsewhere: Secondary | ICD-10-CM | POA: Insufficient documentation

## 2020-03-09 LAB — BASIC METABOLIC PANEL
Anion gap: 11 (ref 5–15)
BUN: 9 mg/dL (ref 6–20)
CO2: 23 mmol/L (ref 22–32)
Calcium: 9.3 mg/dL (ref 8.9–10.3)
Chloride: 102 mmol/L (ref 98–111)
Creatinine, Ser: 0.94 mg/dL (ref 0.61–1.24)
GFR calc Af Amer: >60 mL/min (ref >60)
GFR calc non Af Amer: >60 mL/min (ref >60)
Glucose, Bld: 102 mg/dL — ABNORMAL HIGH (ref 70–99)
Potassium: 4 mmol/L (ref 3.5–5.1)
Sodium: 136 mmol/L (ref 135–145)

## 2020-03-09 LAB — CBC
HCT: 47.7 % (ref 39.0–52.0)
Hemoglobin: 15.8 g/dL (ref 13.0–17.0)
MCH: 31.7 pg (ref 26.0–34.0)
MCHC: 33.1 g/dL (ref 30.0–36.0)
MCV: 95.6 fL (ref 80.0–100.0)
Platelets: 239 10*3/uL (ref 150–400)
RBC: 4.99 MIL/uL (ref 4.22–5.81)
RDW: 14.6 % (ref 11.5–15.5)
WBC: 15.6 10*3/uL — ABNORMAL HIGH (ref 4.0–10.5)
nRBC: 0 % (ref 0.0–0.2)

## 2020-03-09 MED ORDER — HYDROCODONE-ACETAMINOPHEN 5-325 MG PO TABS: 1.0000 | ORAL_TABLET | Freq: Four times a day (QID) | ORAL | 0 refills | Status: AC | PRN

## 2020-03-09 MED ORDER — SULFAMETHOXAZOLE-TRIMETHOPRIM 800-160 MG PO TABS: 1.0000 | ORAL_TABLET | Freq: Two times a day (BID) | ORAL | 0 refills | Status: AC

## 2020-03-09 MED ORDER — LIDOCAINE-EPINEPHRINE 1 %-1:100000 IJ SOLN
10.0000 mL | Freq: Once | INTRAMUSCULAR | Status: AC
  Administered 2020-03-09: 10 mL
  Filled 2020-03-09: qty 1

## 2020-03-09 MED ORDER — SULFAMETHOXAZOLE-TRIMETHOPRIM 800-160 MG PO TABS
1.0000 | ORAL_TABLET | Freq: Once | ORAL | Status: AC
  Administered 2020-03-09: 1 via ORAL
  Filled 2020-03-09: qty 1

## 2020-03-09 NOTE — ED Notes (Signed)
Patient verbalizes understanding of discharge instructions. Opportunity for questioning and answers were provided. Arm band removed by staff, patient discharged from ED. 

## 2020-03-09 NOTE — Discharge Instructions (Addendum)
Please read and follow all provided instructions.  Your diagnoses today include:  1. Abscess of left buttock     Tests performed today include:  Vital signs. See below for your results today.   Medications prescribed:   Bactrim (trimethoprim/sulfamethoxazole) - antibiotic  You have been prescribed an antibiotic medicine: take the entire course of medicine even if you are feeling better. Stopping early can cause the antibiotic not to work.   Vicodin (hydrocodone/acetaminophen) - narcotic pain medication  DO NOT drive or perform any activities that require you to be awake and alert because this medicine can make you drowsy. BE VERY CAREFUL not to take multiple medicines containing Tylenol (also called acetaminophen). Doing so can lead to an overdose which can damage your liver and cause liver failure and possibly death.  Take any prescribed medications only as directed.   Home care instructions:   Follow any educational materials contained in this packet  Follow-up instructions: Return to the Emergency Department in 48 hours for a recheck if your symptoms are not significantly improved.  Please follow-up with your primary care provider in the next 1 week for further evaluation of your symptoms.   Return instructions:  Return to the Emergency Department if you have:  Fever  Worsening symptoms  Worsening pain  Worsening swelling  Redness of the skin that moves away from the affected area, especially if it streaks away from the affected area   Any other emergent concerns   Your vital signs today were: BP 105/79 (BP Location: Right Arm)   Pulse 86   Resp 18   SpO2 99%  If your blood pressure (BP) was elevated above 135/85 this visit, please have this repeated by your doctor within one month. --------------

## 2020-03-09 NOTE — ED Triage Notes (Signed)
Pt presents to ED POV. Pt c/o rectal pain. Pt reports he had abscess on bottom that began sat and has worsened. Pt denies fever.

## 2020-03-09 NOTE — ED Provider Notes (Signed)
[facility] EMERGENCY DEPARTMENT Provider Note   CSN: [id number] Arrival date & time: 03/09/20  0020     History Chief Complaint  Patient presents with  . Abscess    Randy Bonilla is a 34 y.o. male.  Patient presents emergency department with complaint of left buttocks abscess, worsening over the past 4 days.  He states that he has had this once in the past in this area, however it broke open on its own and resolved.  Pain is worse with sitting and applying pressure.  Patient states that he tried to lance it without success.  No fevers, nausea or vomiting.  Last bowel movement was yesterday.  He is able to defecate.  He has been soaking in warm water without improvement.        History reviewed. No pertinent past medical history.  There are no problems to display for this patient.   History reviewed. No pertinent surgical history.     History reviewed. No pertinent family history.  Social History   Tobacco Use  . Smoking status: Current Every Day Smoker    Types: Cigarettes  . Smokeless tobacco: Never Used  Substance Use Topics  . Alcohol use: Yes  . Drug use: No    Home Medications Prior to Admission medications   Medication Sig Start Date End Date Taking? Authorizing Provider  cephALEXin (KEFLEX) 500 MG capsule Take 1 capsule (500 mg total) by mouth 3 (three) times daily. 02/03/16   Hagler, Jami L, PA-C  ibuprofen (ADVIL,MOTRIN) 600 MG tablet Take 1 tablet (600 mg total) by mouth every 6 (six) hours as needed. 02/03/16   Hagler, Jami L, PA-C  methocarbamol (ROBAXIN) 500 MG tablet Take 2 tablets (1,000 mg total) by mouth 4 (four) times daily as needed (Pain). 10/17/15   Pisciotta, Joni Reining, PA-C  oxyCODONE-acetaminophen (PERCOCET) 7.5-325 MG tablet Take 1 tablet by mouth every 6 (six) hours as needed for severe pain. 01/07/17   Joni Reining, PA-C  sulfamethoxazole-trimethoprim (BACTRIM DS,SEPTRA DS) 800-160 MG tablet Take 1 tablet by mouth 2 (two)  times daily. 01/07/17   Joni Reining, PA-C    Allergies    Patient has no known allergies.  Review of Systems   Review of Systems  Constitutional: Negative for fever.  Gastrointestinal: Negative for constipation, nausea, rectal pain and vomiting.  Skin: Negative for color change.       Positive for abscess  Hematological: Negative for adenopathy.    Physical Exam Updated Vital Signs BP 105/79 (BP Location: Right Arm)   Pulse 86   SpO2 99%   Physical Exam Vitals and nursing note reviewed.  Constitutional:      Appearance: He is well-developed.  HENT:     Head: Normocephalic and atraumatic.  Eyes:     Conjunctiva/sclera: Conjunctivae normal.  Pulmonary:     Effort: No respiratory distress.  Genitourinary:    Comments: Patient with 4 cm induration with overlying erythema in the mid left buttock area.  Does not appear to communicate with the rectum or anus.  Area is very tender to palpation.  Not actively draining. Musculoskeletal:     Cervical back: Normal range of motion and neck supple.  Skin:    General: Skin is warm and dry.  Neurological:     Mental Status: He is alert.     ED Results / Procedures / Treatments   Labs (all labs ordered are listed, but only abnormal results are displayed) Labs Reviewed  CBC -  Abnormal; Notable for the following components:      Result Value   WBC 15.6 (*)    All other components within normal limits  BASIC METABOLIC PANEL - Abnormal; Notable for the following components:   Glucose, Bld 102 (*)    All other components within normal limits    EKG None  Radiology No results found.  Procedures Procedures (including critical care time)  Medications Ordered in ED Medications  sulfamethoxazole-trimethoprim (BACTRIM DS) 800-160 MG per tablet 1 tablet (1 tablet Oral Given 03/09/20 1002)  lidocaine-EPINEPHrine (XYLOCAINE W/EPI) 1 %-1:100000 (with pres) injection 10 mL (10 mLs Infiltration Given by Other 03/09/20 1002)    ED  Course  I have reviewed the triage vital signs and the nursing notes.  Pertinent labs & imaging results that were available during my care of the patient were reviewed by me and considered in my medical decision making (see chart for details).  Patient seen and examined. Work-up reviewed.  Elevated white blood cell count expected given abscess.  Medications ordered.  Discussed incision and drainage procedure with patient and he agrees to proceed.  Vital signs reviewed and are as follows: BP (!) 131/73 (BP Location: Right Arm)   Pulse 75   Temp 98.9 F (37.2 C) (Oral)   Resp 16   SpO2 100%   Procedure completed without complication.  The patient was urged to return to the Emergency Department urgently with worsening pain, swelling, expanding erythema especially if it streaks away from the affected area, fever, or if they have any other concerns.   The patient was urged to return to the Emergency Department or go to their PCP in 48 hours for wound recheck if the area is not significantly improved.   Patient counseled on use of narcotic pain medications. Counseled not to combine these medications with others containing tylenol. Urged not to drink alcohol, drive, or perform any other activities that requires focus while taking these medications. The patient verbalizes understanding and agrees with the plan.   MDM Rules/Calculators/A&P                          Patient with skin abscess amenable to incision and drainage.  Due to size and location, patient started on oral antibiotics.  White blood cell count elevated, anticipated.  No fevers reported.  Patient looks well, nontoxic.   Final Clinical Impression(s) / ED Diagnoses Final diagnoses:  Abscess of left buttock    Rx / DC Orders ED Discharge Orders         Ordered    sulfamethoxazole-trimethoprim (BACTRIM DS) 800-160 MG tablet  2 times daily     Discontinue  Reprint     03/09/20 1023    HYDROcodone-acetaminophen (NORCO/VICODIN)  5-325 MG tablet  Every 6 hours PRN     Discontinue  Reprint     03/09/20 1023           Renne Crigler, PA-C 03/09/20 1033    Benjiman Core, MD 03/09/20 [phone]

## 2024-05-18 ENCOUNTER — Other Ambulatory Visit: Payer: Self-pay | Admitting: Family Medicine

## 2024-05-18 DIAGNOSIS — S8391XD Sprain of unspecified site of right knee, subsequent encounter: Secondary | ICD-10-CM

## 2024-05-18 DIAGNOSIS — N6489 Other specified disorders of breast: Secondary | ICD-10-CM

## 2024-05-18 DIAGNOSIS — S43401D Unspecified sprain of right shoulder joint, subsequent encounter: Secondary | ICD-10-CM

## 2024-05-22 ENCOUNTER — Ambulatory Visit
Admission: RE | Admit: 2024-05-22 | Discharge: 2024-05-22 | Disposition: A | Discharge status: Home / Self Care | Payer: Worker's Compensation | Source: Ambulatory Visit | Attending: Family Medicine | Admitting: Family Medicine

## 2024-05-22 DIAGNOSIS — S8391XD Sprain of unspecified site of right knee, subsequent encounter: Secondary | ICD-10-CM

## 2024-05-22 DIAGNOSIS — S43401D Unspecified sprain of right shoulder joint, subsequent encounter: Secondary | ICD-10-CM

## 2024-05-24 ENCOUNTER — Other Ambulatory Visit: Payer: Self-pay | Admitting: Family

## 2024-05-24 DIAGNOSIS — S20212A Contusion of left front wall of thorax, initial encounter: Secondary | ICD-10-CM

## 2024-05-26 ENCOUNTER — Ambulatory Visit
Admission: RE | Admit: 2024-05-26 | Discharge: 2024-05-26 | Disposition: A | Discharge status: Home / Self Care | Payer: Worker's Compensation | Source: Ambulatory Visit | Attending: Family | Admitting: Family

## 2024-05-26 ENCOUNTER — Other Ambulatory Visit: Payer: Self-pay

## 2024-05-26 DIAGNOSIS — S20212A Contusion of left front wall of thorax, initial encounter: Secondary | ICD-10-CM
